# Patient Record
Sex: Female | Born: 1945
Health system: Southern US, Community
[De-identification: ages and names within clinical notes are randomized; demographics above are authoritative.]

---

## 2013-10-10 ENCOUNTER — Inpatient Hospital Stay (HOSPITAL_COMMUNITY)
Admission: RE | Admit: 2013-10-10 | Discharge: 2013-10-10 | Disposition: A | Discharge status: Home / Self Care | Payer: Medicare Other | Source: Ambulatory Visit | Attending: Neurology | Admitting: Neurology

## 2013-10-10 DIAGNOSIS — G9340 Encephalopathy, unspecified: Secondary | ICD-10-CM

## 2013-10-10 DIAGNOSIS — R4182 Altered mental status, unspecified: Secondary | ICD-10-CM

## 2013-10-11 DIAGNOSIS — R4182 Altered mental status, unspecified: Secondary | ICD-10-CM | POA: Insufficient documentation

## 2013-10-11 NOTE — Procedures (Signed)
History: 68 yo F with previous strkoe being evaluated for encephalopathy.   Sedating medications: morphine  Technique: This is a 17 channel routine scalp EEG performed at the bedside with bipolar and monopolar montages arranged in accordance to the international 10/20 system of electrode placement. One channel was dedicated to EKG recording.    Background: The background consists of intermixed irregular delta and theta frequencies with some alpha seen at times A Posterior rhythm reactive to eye opening/closure is seen achieving a frequency of 10.5 Hz.    Photic stimulation: Physiologic driving is not performed   EEG Abnormalities: 1) generalized irregular slow activity  Clinical Interpretation: This EEG is most consistent with a generalized non-specific cerebral dysfunction. There was no seizure or seizure predisposition recorded on this study.   [name], MD [facility] [phone]  If 7pm- 7am, please page neurology on call as listed in [facility].

## 2013-10-29 ENCOUNTER — Other Ambulatory Visit (HOSPITAL_COMMUNITY): Payer: Self-pay | Admitting: Internal Medicine

## 2013-10-29 DIAGNOSIS — I639 Cerebral infarction, unspecified: Secondary | ICD-10-CM

## 2013-11-07 ENCOUNTER — Other Ambulatory Visit (HOSPITAL_COMMUNITY): Payer: Self-pay | Admitting: Internal Medicine

## 2013-11-07 ENCOUNTER — Ambulatory Visit (HOSPITAL_COMMUNITY)
Admission: RE | Admit: 2013-11-07 | Discharge: 2013-11-07 | Disposition: A | Discharge status: Home / Self Care | Payer: Medicare Other | Source: Ambulatory Visit | Attending: Internal Medicine | Admitting: Internal Medicine

## 2013-11-07 ENCOUNTER — Ambulatory Visit (HOSPITAL_COMMUNITY): Payer: Medicare Other

## 2013-11-07 DIAGNOSIS — R6889 Other general symptoms and signs: Secondary | ICD-10-CM

## 2013-11-07 DIAGNOSIS — R4182 Altered mental status, unspecified: Secondary | ICD-10-CM

## 2013-11-08 ENCOUNTER — Ambulatory Visit (HOSPITAL_COMMUNITY)
Admission: RE | Admit: 2013-11-08 | Discharge: 2013-11-08 | Disposition: A | Discharge status: Home / Self Care | Payer: Medicare Other | Source: Ambulatory Visit | Attending: Internal Medicine | Admitting: Internal Medicine

## 2013-11-08 DIAGNOSIS — J9600 Acute respiratory failure, unspecified whether with hypoxia or hypercapnia: Secondary | ICD-10-CM | POA: Insufficient documentation

## 2013-11-08 DIAGNOSIS — Z9911 Dependence on respirator [ventilator] status: Secondary | ICD-10-CM | POA: Insufficient documentation

## 2013-11-08 DIAGNOSIS — R6889 Other general symptoms and signs: Secondary | ICD-10-CM

## 2013-11-08 DIAGNOSIS — R4182 Altered mental status, unspecified: Secondary | ICD-10-CM

## 2013-11-12 ENCOUNTER — Ambulatory Visit (HOSPITAL_COMMUNITY): Payer: Medicare Other

## 2013-11-12 ENCOUNTER — Ambulatory Visit (HOSPITAL_COMMUNITY): Admission: RE | Admit: 2013-11-12 | Payer: Medicare Other | Source: Ambulatory Visit

## 2016-04-06 IMAGING — MR MR CERVICAL SPINE W/O CM
4 of 6 series · 19 of 48 positions shown · non-contrast
Comparison: None.

CLINICAL DATA: Altered mental status. Difficulty moving head. Acute
respiratory failure. Ventilator patient.

EXAM:
MRI CERVICAL SPINE WITHOUT CONTRAST
TECHNIQUE: Multiplanar, multisequence MR imaging of the cervical spine was
performed. No intravenous contrast was administered.

[Series 3: T2 · sagittal · 3.0mm · 0.39mm/px · 6 of 13 slices shown (1 of 3)]
[im 1/13]
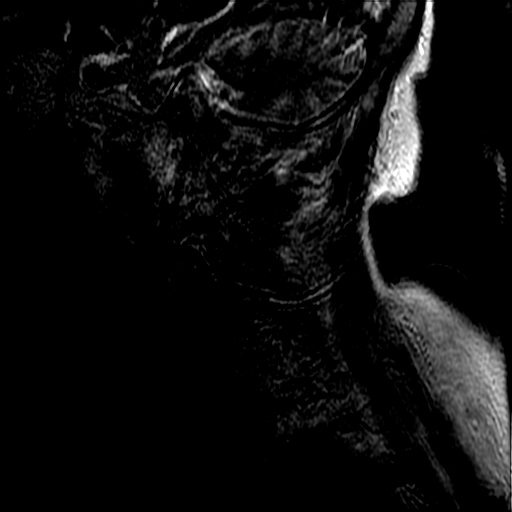
[im 3/13]
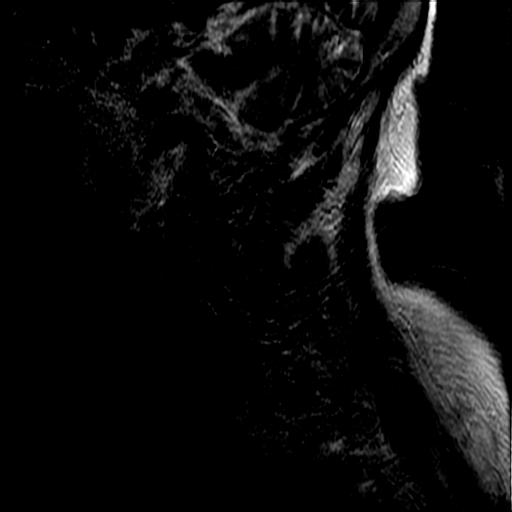
[im 5/13]
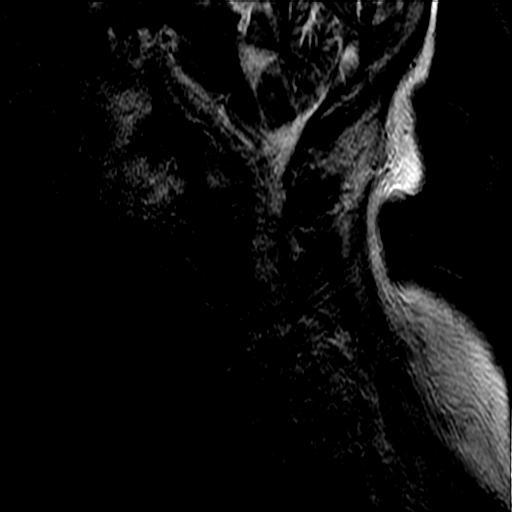
[im 8/13]
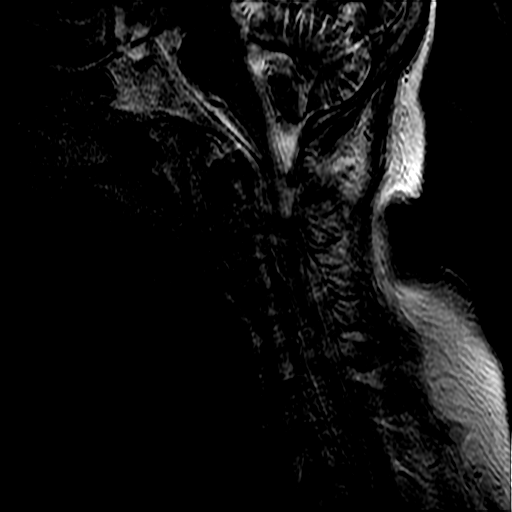
[im 10/13]
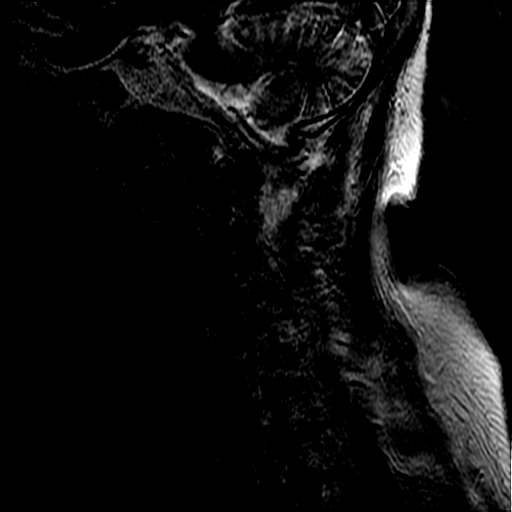
[im 13/13]
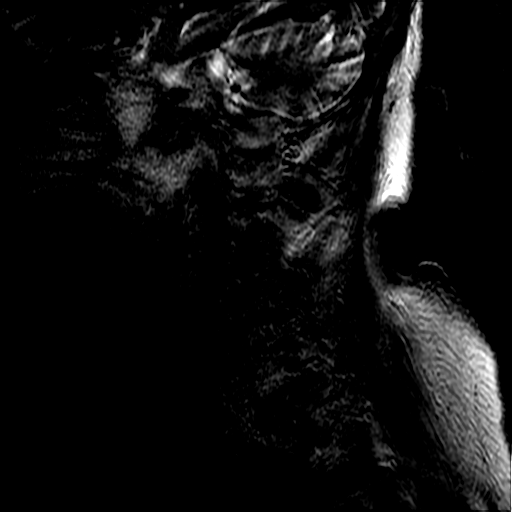

[Series 7: T2 · sagittal · 3.0mm · 0.39mm/px · 7 of 13 slices shown (2 of 3)]
[im 1/13]
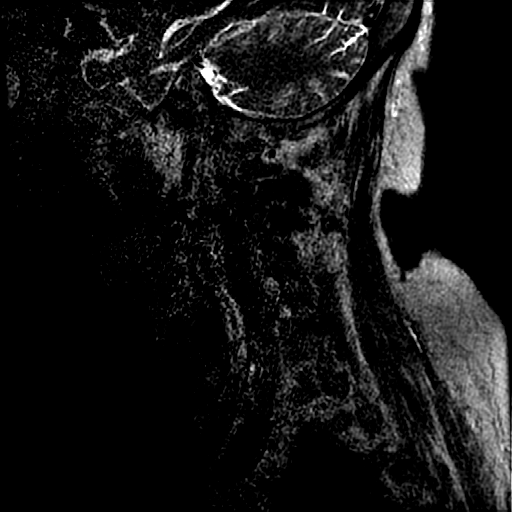
[im 3/13]
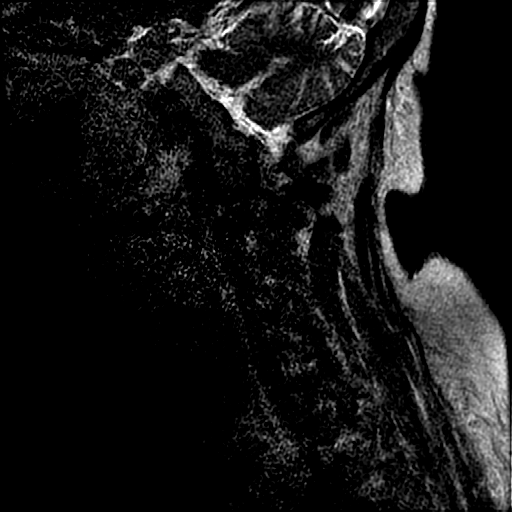
[im 5/13]
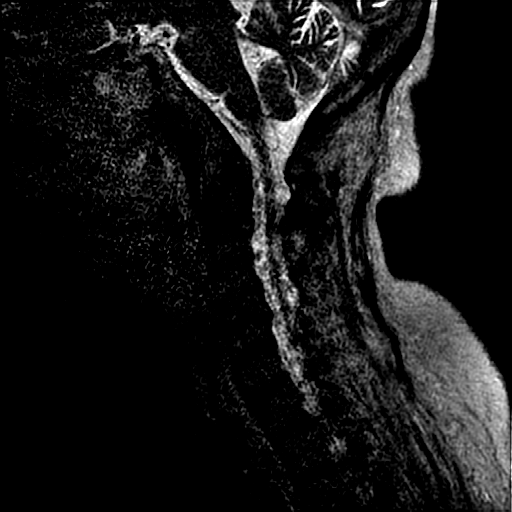
[im 7/13]
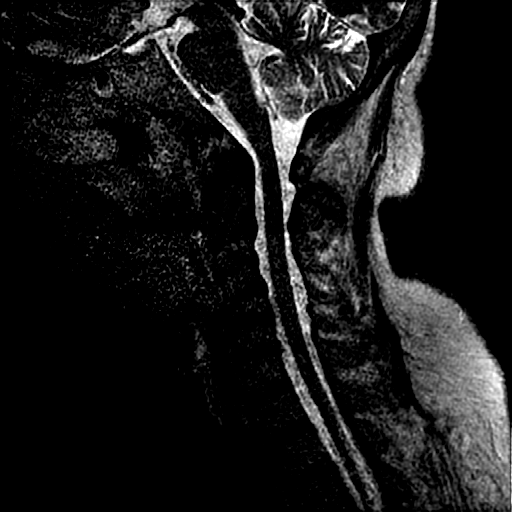
[im 9/13]
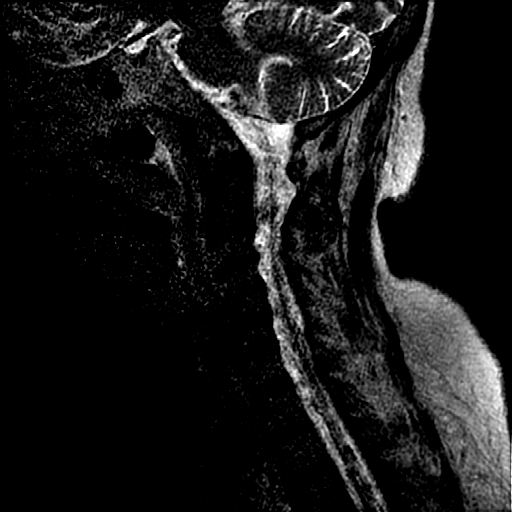
[im 11/13]
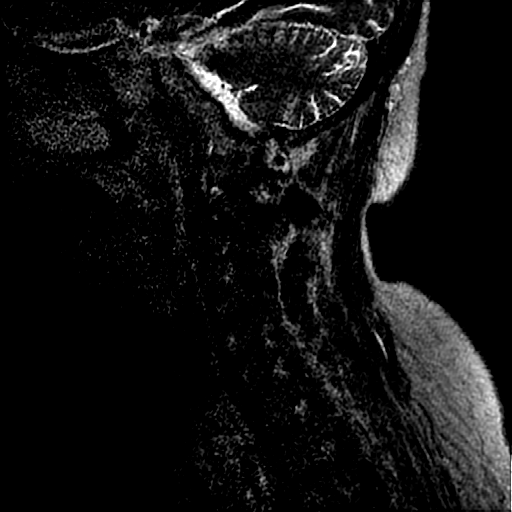
[im 13/13]
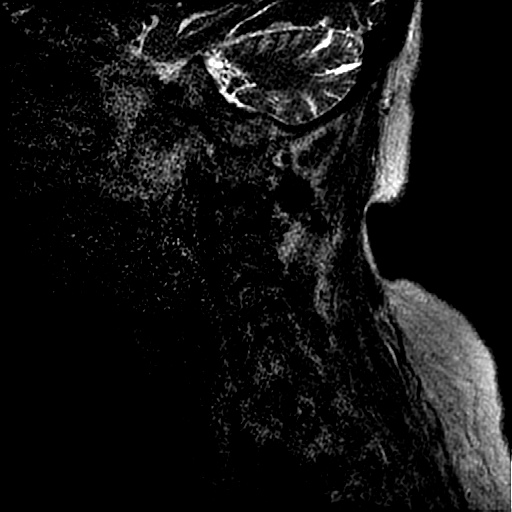

[Series 8: STIR · sagittal · 3.0mm · 0.39mm/px · 3 of 13 slices shown]
[im 3/13]
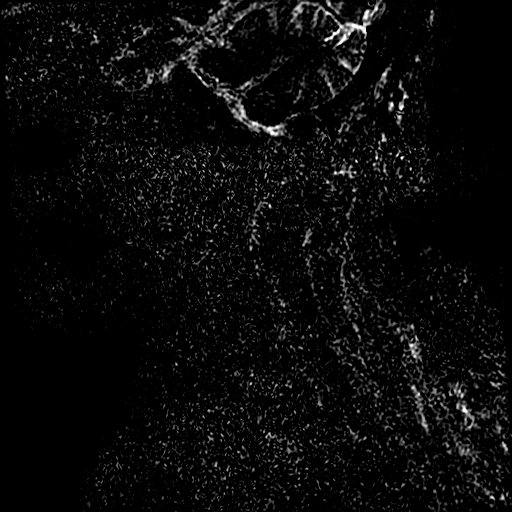
[im 7/13]
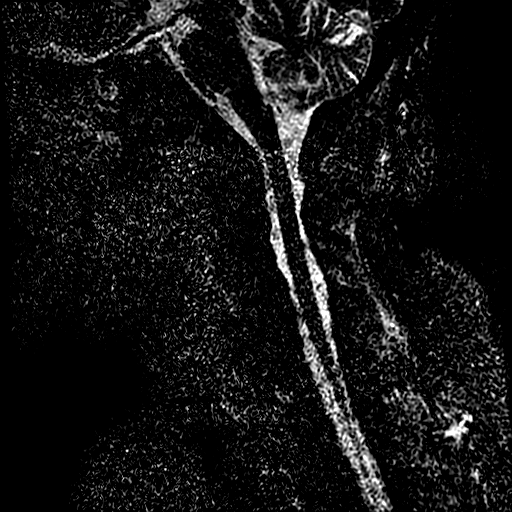
[im 11/13]
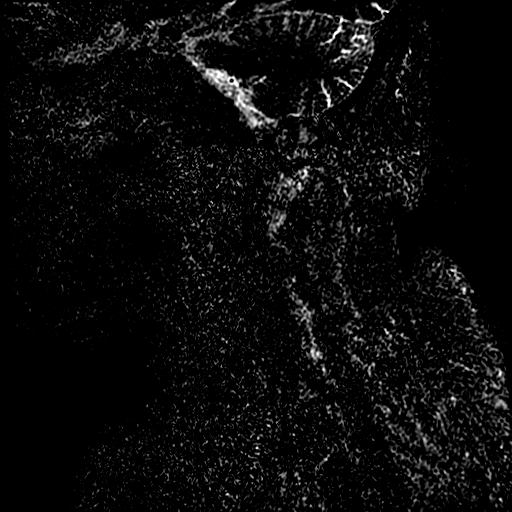

[Series 11: T2 · axial · 3.0mm · 0.39mm/px · z∈[-159,-92]mm · 3 of 30 slices shown (3 of 3)]
[im 5/30]
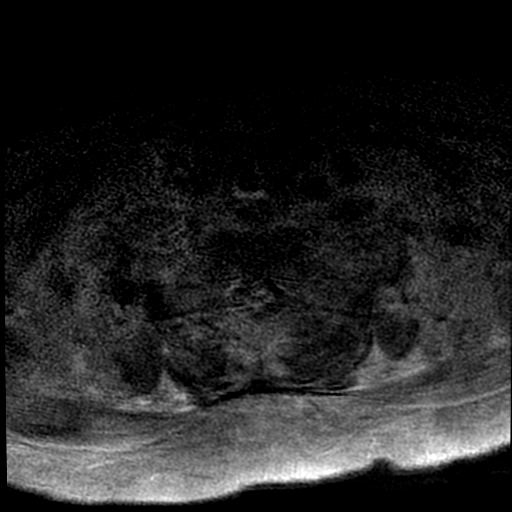
[im 15/30]
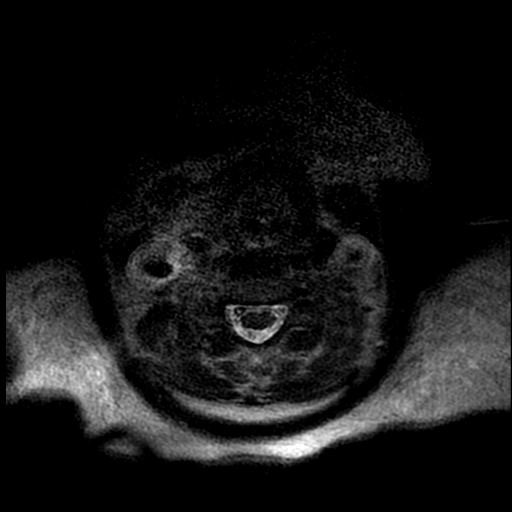
[im 25/30]
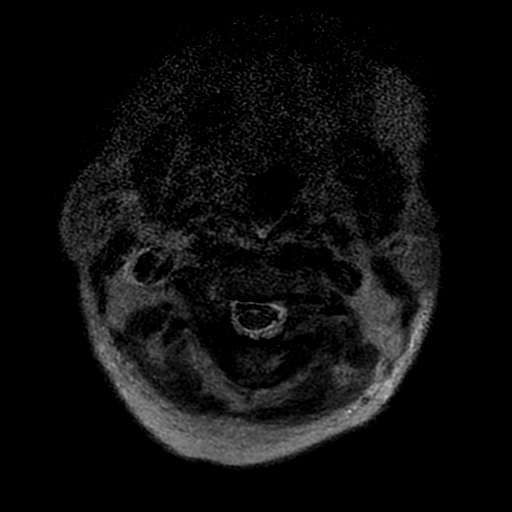

[19 of 48 positions shown; findings below may reference images not displayed]

FINDINGS: The study is moderately degraded by patient motion. Normal signal is
present in the conus medullaris patch that minimal signal is present
in the cervical and upper thoracic spinal cord to the lowest imaged
level, T2-3. The craniocervical junction is within normal limits.
Marrow signal and vertebral body heights are normal. There is some
straightening of the normal cervical lordosis.

C2-3: Asymmetric left-sided facet hypertrophy contributes to mild
left foraminal narrowing.

C3-4: Uncovertebral spurring leads to mild foraminal narrowing
bilaterally.

C4-5: A broad-based disc osteophyte complex is present. There is
partial effacement of the ventral CSF with mild central and
bilateral foraminal narrowing, worse on the left.

C5-6: Mild uncovertebral spurring is present bilaterally without
significant stenosis.

C6-7:  Negative.
IMPRESSION: 1. Normal signal is present throughout the cervical and upper
thoracic spinal cord.
2. Mild spondylosis with multilevel foraminal narrowing as
described. The details are degraded by a significant patient motion.

## 2022-03-11 DEATH — deceased
